# Patient Record
Sex: Male | Born: 1960 | Race: White | Hispanic: No | Marital: Married | Smoking: Never smoker
Health system: Southern US, Community
[De-identification: ages and names within clinical notes are randomized; demographics above are authoritative.]

## PROBLEM LIST (undated history)

## (undated) DIAGNOSIS — L409 Psoriasis, unspecified: Secondary | ICD-10-CM

## (undated) HISTORY — DX: Psoriasis, unspecified: L40.9

---

## 2007-01-28 ENCOUNTER — Ambulatory Visit: Payer: Self-pay | Admitting: Family Medicine

## 2007-01-28 ENCOUNTER — Telehealth (INDEPENDENT_AMBULATORY_CARE_PROVIDER_SITE_OTHER): Payer: Self-pay | Admitting: *Deleted

## 2007-01-28 DIAGNOSIS — R040 Epistaxis: Secondary | ICD-10-CM

## 2007-01-28 LAB — CONVERTED CEMR LAB
Albumin: 4.4 g/dL (ref 3.5–5.2)
BUN: 13 mg/dL (ref 6–23)
CO2: 31 meq/L (ref 19–32)
Calcium: 9.7 mg/dL (ref 8.4–10.5)
Chloride: 101 meq/L (ref 96–112)
Creatinine, Ser: 1 mg/dL (ref 0.40–1.50)
Glucose, Bld: 108 mg/dL — ABNORMAL HIGH (ref 70–99)
Platelets: 323 10*3/uL (ref 150–400)
RDW: 13 % (ref 11.5–14.0)
WBC: 10.4 10*3/uL (ref 4.0–10.5)

## 2020-04-21 ENCOUNTER — Emergency Department
Admission: EM | Admit: 2020-04-21 | Discharge: 2020-04-21 | Disposition: A | Discharge status: Home / Self Care | Payer: Self-pay | Source: Home / Self Care | Attending: Family Medicine | Admitting: Family Medicine

## 2020-04-21 ENCOUNTER — Other Ambulatory Visit: Payer: Self-pay

## 2020-04-21 ENCOUNTER — Emergency Department (INDEPENDENT_AMBULATORY_CARE_PROVIDER_SITE_OTHER): Payer: Self-pay

## 2020-04-21 DIAGNOSIS — R059 Cough, unspecified: Secondary | ICD-10-CM

## 2020-04-21 DIAGNOSIS — J069 Acute upper respiratory infection, unspecified: Secondary | ICD-10-CM

## 2020-04-21 MED ORDER — HYDROCODONE-HOMATROPINE 5-1.5 MG/5ML PO SYRP: 5.0000 mL | ORAL_SOLUTION | Freq: Four times a day (QID) | ORAL | 0 refills | Status: AC | PRN

## 2020-04-21 NOTE — ED Provider Notes (Signed)
[facility]    CSN: [id number] Arrival date & time: 04/21/20  1631      History   Chief Complaint Chief Complaint  Patient presents with  . Fatigue    HPI Harold Bass is a 59 y.o. male.   Patient is comfortable and now has a dry cough.  Has had some low-grade fever.  Feels fatigue has loss of sense of taste and smell.  HPI  Past Medical History:  Diagnosis Date  . Psoriasis     Patient Active Problem List   Diagnosis Date Noted  . EPISTAXIS, RECURRENT 01/28/2007    History reviewed. No pertinent surgical history.     Home Medications    Prior to Admission medications   Not on File    Family History History reviewed. No pertinent family history.  Social History Social History   Tobacco Use  . Smoking status: Never Smoker  . Smokeless tobacco: Never Used  Vaping Use  . Vaping Use: Never used  Substance Use Topics  . Alcohol use: Never  . Drug use: Never     Allergies   Patient has no known allergies.   Review of Systems Review of Systems  Constitutional: Positive for fatigue and fever.  Respiratory: Positive for cough.   All other systems reviewed and are negative.    Physical Exam Triage Vital Signs ED Triage Vitals  Enc Vitals Group     BP 04/21/20 1654 136/80     Pulse Rate 04/21/20 1654 93     Resp 04/21/20 1654 19     Temp 04/21/20 1654 (!) 97.4 F (36.3 C)     Temp Source 04/21/20 1654 Oral     SpO2 04/21/20 1654 93 %     Weight 04/21/20 1655 171 lb 15.3 oz (78 kg)     Height 04/21/20 1655 5\' 8"  (1.727 m)     Head Circumference --      Peak Flow --      Pain Score 04/21/20 1654 0     Pain Loc --      Pain Edu? --      Excl. in GC? --    No data found.  Updated Vital Signs BP 136/80 (BP Location: Left Arm)   Pulse 93   Temp (!) 97.4 F (36.3 C) (Oral)   Resp 19   Ht 5\' 8"  (1.727 m)   Wt 78 kg   SpO2 93%   BMI 26.15 kg/m   Visual Acuity Right Eye Distance:   Left Eye Distance:   Bilateral  Distance:    Right Eye Near:   Left Eye Near:    Bilateral Near:     Physical Exam Vitals and nursing note reviewed.  Constitutional:      Appearance: Normal appearance.  HENT:     Head: Normocephalic.     Mouth/Throat:     Mouth: Mucous membranes are moist.     Pharynx: Oropharynx is clear.  Cardiovascular:     Rate and Rhythm: Normal rate and regular rhythm.  Pulmonary:     Effort: Pulmonary effort is normal.     Breath sounds: Normal breath sounds.  Neurological:     Mental Status: He is alert.      UC Treatments / Results  Labs (all labs ordered are listed, but only abnormal results are displayed) Labs Reviewed - No data to display  EKG   Radiology No results found.  Procedures Procedures (including critical care time)  Medications Ordered  in UC Medications - No data to display  Initial Impression / Assessment and Plan / UC Course  I have reviewed the triage vital signs and the nursing notes.  Pertinent labs & imaging results that were available during my care of the patient were reviewed by me and considered in my medical decision making (see chart for details).     Viral respiratory infection with cough and Covid Final Clinical Impressions(s) / UC Diagnoses   Final diagnoses:  None   Discharge Instructions   None    ED Prescriptions    None     PDMP not reviewed this encounter.   Frederica Kuster, MD 04/21/20 [phone]

## 2020-04-21 NOTE — ED Triage Notes (Signed)
Pt presents after taking a covid home test (positive on 12/17). C/o persistent fatigue and cough. Otc meds tamiflu and aspirin. Not vaccinated. No other concerns.

## 2021-06-05 IMAGING — DX DG CHEST 2V
2 series · 2 of 2 positions shown · non-contrast
Comparison: None

CLINICAL DATA: Persistent cough and fatigue, tested positive for
[id number] on a home test on 04/16/2020

EXAM:
CHEST - 2 VIEW

[chest pa]
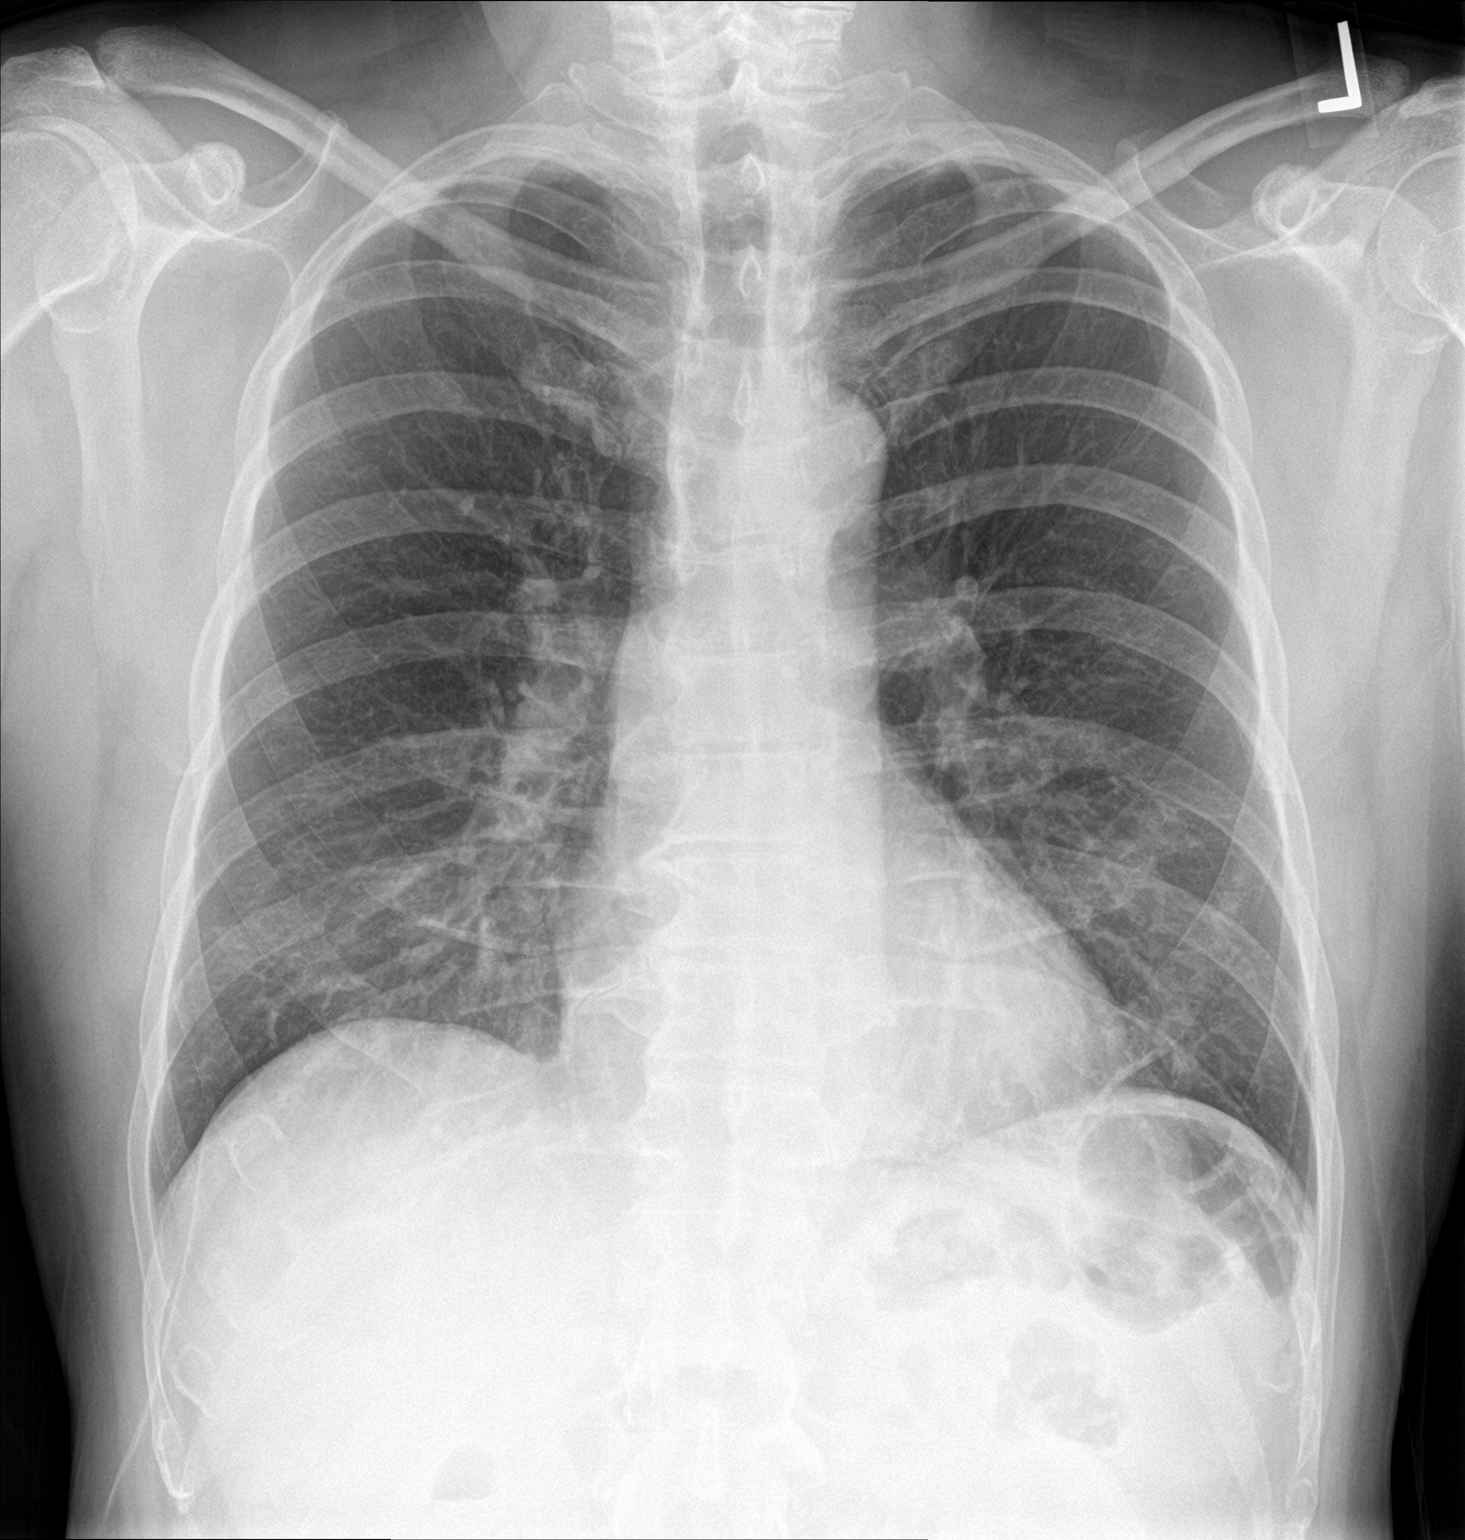

[chest lat]
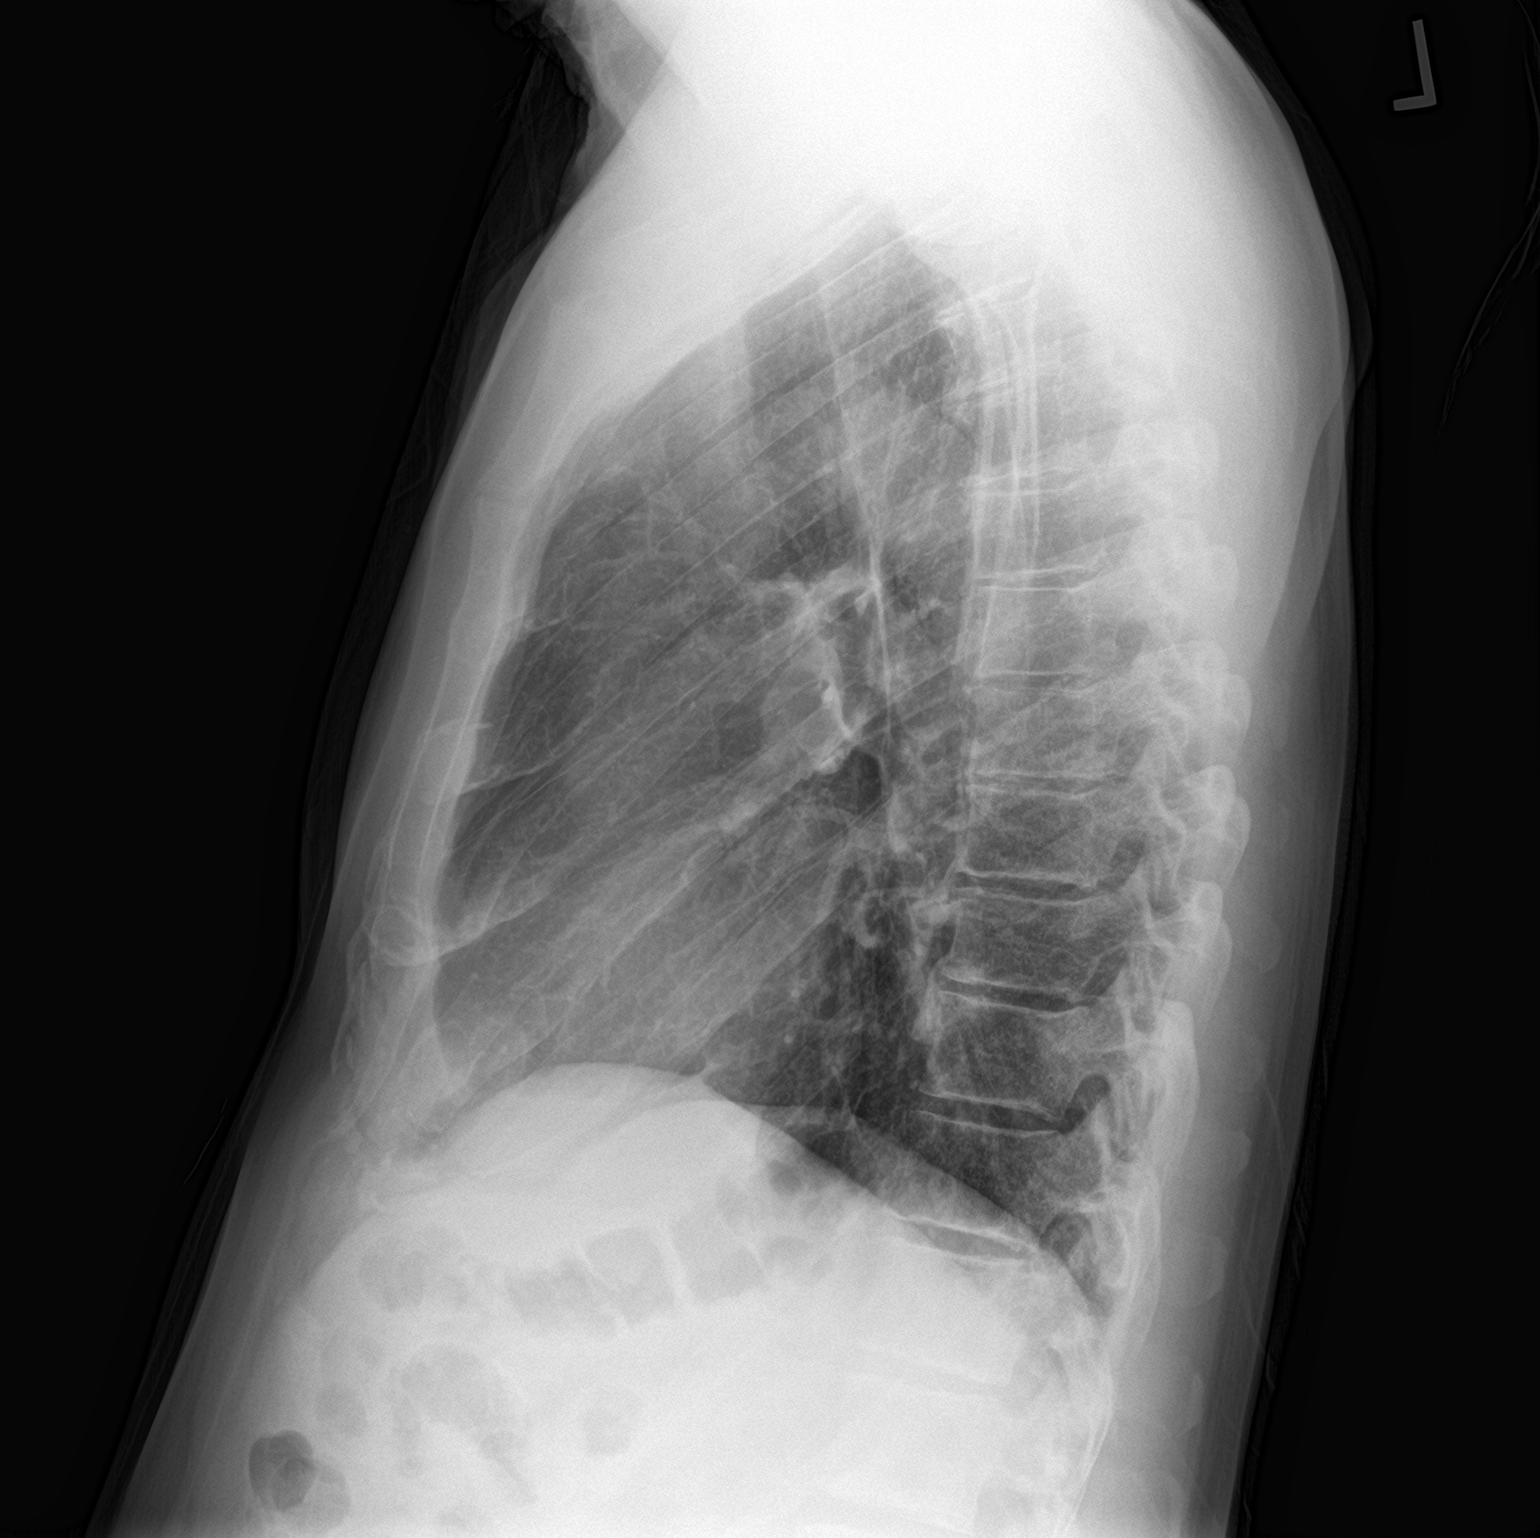

[2 of 2 positions shown; findings below may reference images not displayed]

FINDINGS: Normal heart size, mediastinal contours, and pulmonary vascularity.

Questionable LEFT basilar infiltrate.

Remaining lungs clear.

No pleural effusion or pneumothorax.
IMPRESSION: Questionable LEFT basilar infiltrate.
